# Patient Record
Sex: Female | Born: 1970 | Race: White | Hispanic: No | Smoking: Current every day smoker
Health system: Southern US, Community
[De-identification: ages and names within clinical notes are randomized; demographics above are authoritative.]

## PROBLEM LIST (undated history)

## (undated) DIAGNOSIS — N289 Disorder of kidney and ureter, unspecified: Secondary | ICD-10-CM

## (undated) HISTORY — PX: TUBAL LIGATION: SHX77

---

## 2005-06-18 ENCOUNTER — Ambulatory Visit: Payer: Self-pay | Admitting: Family Medicine

## 2005-07-02 ENCOUNTER — Ambulatory Visit: Payer: Self-pay | Admitting: Family Medicine

## 2005-07-17 ENCOUNTER — Observation Stay: Payer: Self-pay | Admitting: Obstetrics and Gynecology

## 2005-08-31 ENCOUNTER — Observation Stay: Payer: Self-pay | Admitting: Obstetrics and Gynecology

## 2005-09-03 ENCOUNTER — Inpatient Hospital Stay: Payer: Self-pay | Admitting: Obstetrics and Gynecology

## 2005-10-23 ENCOUNTER — Ambulatory Visit: Payer: Self-pay | Admitting: Obstetrics and Gynecology

## 2006-12-12 ENCOUNTER — Ambulatory Visit: Payer: Self-pay | Admitting: Internal Medicine

## 2007-06-09 ENCOUNTER — Ambulatory Visit: Payer: Self-pay | Admitting: Internal Medicine

## 2009-03-07 ENCOUNTER — Emergency Department: Payer: Self-pay | Admitting: Emergency Medicine

## 2009-11-06 ENCOUNTER — Emergency Department: Payer: Self-pay | Admitting: Emergency Medicine

## 2009-11-22 ENCOUNTER — Ambulatory Visit: Payer: Self-pay | Admitting: Urology

## 2012-05-13 ENCOUNTER — Ambulatory Visit: Payer: Self-pay

## 2012-05-13 LAB — COMPREHENSIVE METABOLIC PANEL
Albumin: 3.5 g/dL (ref 3.4–5.0)
Alkaline Phosphatase: 107 U/L (ref 50–136)
Anion Gap: 7 (ref 7–16)
Bilirubin,Total: 0.2 mg/dL (ref 0.2–1.0)
Chloride: 106 mmol/L (ref 98–107)
Co2: 28 mmol/L (ref 21–32)
Creatinine: 0.67 mg/dL (ref 0.60–1.30)
EGFR (Non-African Amer.): >60
Osmolality: 280 (ref 275–301)
SGOT(AST): 20 U/L (ref 15–37)
Sodium: 141 mmol/L (ref 136–145)
Total Protein: 7.3 g/dL (ref 6.4–8.2)

## 2012-05-13 LAB — URINALYSIS, COMPLETE
Glucose,UR: NEGATIVE mg/dL (ref 0–75)
Leukocyte Esterase: NEGATIVE
Nitrite: NEGATIVE
Ph: 6 (ref 4.5–8.0)
Specific Gravity: 1.025 (ref 1.003–1.030)

## 2012-05-13 LAB — RAPID INFLUENZA A&B ANTIGENS

## 2012-05-13 LAB — CBC WITH DIFFERENTIAL/PLATELET
Basophil #: 0.1 10*3/uL (ref 0.0–0.1)
Basophil %: 1.2 %
Eosinophil %: 3 %
Lymphocyte #: 2.7 10*3/uL (ref 1.0–3.6)
Lymphocyte %: 29.9 %
MCH: 23.4 pg — ABNORMAL LOW (ref 26.0–34.0)
MCHC: 30.6 g/dL — ABNORMAL LOW (ref 32.0–36.0)
Monocyte %: 5.7 %
Platelet: 306 10*3/uL (ref 150–440)
RBC: 4.71 10*6/uL (ref 3.80–5.20)
RDW: 17.1 % — ABNORMAL HIGH (ref 11.5–14.5)
WBC: 8.9 10*3/uL (ref 3.6–11.0)

## 2012-05-14 LAB — URINE CULTURE

## 2012-11-18 ENCOUNTER — Ambulatory Visit: Payer: Self-pay | Admitting: Family Medicine

## 2013-11-17 ENCOUNTER — Ambulatory Visit: Payer: Self-pay | Admitting: Physician Assistant

## 2013-11-20 ENCOUNTER — Ambulatory Visit: Payer: Self-pay | Admitting: Physician Assistant

## 2015-07-19 ENCOUNTER — Encounter: Payer: Self-pay | Admitting: *Deleted

## 2015-07-19 ENCOUNTER — Ambulatory Visit (INDEPENDENT_AMBULATORY_CARE_PROVIDER_SITE_OTHER): Payer: Self-pay

## 2015-07-19 ENCOUNTER — Ambulatory Visit
Admission: EM | Admit: 2015-07-19 | Discharge: 2015-07-19 | Disposition: A | Discharge status: Home / Self Care | Payer: Self-pay | Attending: Family Medicine | Admitting: Family Medicine

## 2015-07-19 DIAGNOSIS — T148 Other injury of unspecified body region: Secondary | ICD-10-CM

## 2015-07-19 DIAGNOSIS — M79672 Pain in left foot: Secondary | ICD-10-CM

## 2015-07-19 DIAGNOSIS — S93402A Sprain of unspecified ligament of left ankle, initial encounter: Secondary | ICD-10-CM

## 2015-07-19 DIAGNOSIS — T07XXXA Unspecified multiple injuries, initial encounter: Secondary | ICD-10-CM

## 2015-07-19 HISTORY — DX: Disorder of kidney and ureter, unspecified: N28.9

## 2015-07-19 MED ORDER — TRAMADOL HCL 50 MG PO TABS: 50.0000 mg | ORAL_TABLET | Freq: Three times a day (TID) | ORAL | Status: DC | PRN

## 2015-07-19 MED ORDER — IBUPROFEN 800 MG PO TABS: 800.0000 mg | ORAL_TABLET | Freq: Three times a day (TID) | ORAL | Status: AC | PRN

## 2015-07-19 NOTE — ED Provider Notes (Signed)
[facility]  ____________________________________________  Time seen: Approximately 3:00PM  I have reviewed the triage vital signs and the nursing notes.   HISTORY  Chief Complaint Foot Pain   HPI Autumn Edwards is a 45 y.o. female  presents with a complaint of left foot and left ankle pain since yesterday afternoon. Patient reports that she was going out the door to go to work and states that she stepped in between 2 decorative bricks that had a gap causing her to roll her left ankle and fall. Patient states that she fell down only 2 or 3 steps. Denies head injury or loss consciousness. Reports continued to work last night and overnight with some pain. Reports has remained ambulatory. States current pain is 6 out of 10 to left foot. Denies pain radiation. Denies numbness or tingling sensation.  States that she fell only because of rolling her ankle. Denies head injury or loss of consciousness. Denies neck or back pain or injury. Denies other extremity pain or injury.  Denies chest pain, shortness of breath, dizziness, weakness, hearing changes, abdominal pain, dysuria, neck pain, back pain, or other extremity swelling. Denies recent sickness.  PCP: [facility]  Patient's last menstrual period was 07/12/2015. Denies chance of pregnancy.    Past Medical History  Diagnosis Date  . Renal disorder     There are no active problems to display for this patient.   Past Surgical History  Procedure Laterality Date  . Tubal ligation      Current Outpatient Rx  Name  Route  Sig  Dispense  Refill                          Allergies Amoxicillin  Family History  Problem Relation Age of Onset  . Hypertension Father     Social History Social History  Substance Use Topics  . Smoking status: Current Every Day Smoker  . Smokeless tobacco: Never Used  . Alcohol Use: No    Review of Systems Constitutional: No fever/chills Eyes: No visual changes. ENT: No sore  throat. Cardiovascular: Denies chest pain. Respiratory: Denies shortness of breath. Gastrointestinal: No abdominal pain.  No nausea, no vomiting.  No diarrhea.  No constipation. Genitourinary: Negative for dysuria. Musculoskeletal: Negative for back pain.Left foot and left ankle pain. Skin: Negative for rash. Neurological: Negative for headaches, focal weakness or numbness.  10-point ROS otherwise negative.  ____________________________________________   PHYSICAL EXAM:  VITAL SIGNS: ED Triage Vitals  Enc Vitals Group     BP 07/19/15 1340 101/69 mmHg     Pulse Rate 07/19/15 1340 66     Resp 07/19/15 1340 18     Temp 07/19/15 1340 97.8 F (36.6 C)     Temp Source 07/19/15 1340 Oral     SpO2 07/19/15 1340 99 %     Weight 07/19/15 1340 250 lb (113.399 kg)     Height 07/19/15 1340  (1.626 m)     Head Cir --      Peak Flow --      Pain Score 07/19/15 1346 7     Pain Loc --      Pain Edu? --      Excl. in [place]? --     Constitutional: Alert and oriented. Well appearing and in no acute distress. Eyes: Conjunctivae are normal. PERRL. EOMI. Head: Atraumatic. Nontender. No ecchymosis.  Nose: No congestion/rhinnorhea.  Mouth/Throat: Mucous membranes are moist.  Oropharynx non-erythematous. Neck: No stridor.  No cervical spine tenderness to palpation. Hematological/Lymphatic/Immunilogical: No cervical lymphadenopathy. Cardiovascular: Normal rate, regular rhythm. Grossly normal heart sounds.  Good peripheral circulation. Respiratory: Normal respiratory effort.  No retractions. Lungs CTAB. Gastrointestinal: Soft and nontender. Musculoskeletal: No lower or upper extremity tenderness nor edema. . Bilateral pedal pulses equal and easily palpated. No cervical, thoracic or lumbar tenderness to palpation. Right lower calf mild ecchymosis present, nontender, no swelling, skin intact, no bony tenderness, full range of motion.  Except: Left lateral ankle and left lateral and dorsal mid foot  mild to moderate tenderness to palpation, mild ecchymosis, minimal swelling, left foot pain with plantar flexion and dorsiflexion as well as left ankle rotation, full range of motion present however with pain, capillary refill less than 2 seconds on the left foot distal toes, bilateral pedal pulses equal. Bilateral lower extremities otherwise nontender. Mild antalgic gait. Neurologic:  Normal speech and language. No gross focal neurologic deficits are appreciated.  Skin:  Skin is warm, dry and intact. No rash noted. Psychiatric: Mood and affect are normal. Speech and behavior are normal.   ____________________________________________   LABS (all labs ordered are listed, but only abnormal results are displayed)  Labs Reviewed - No data to display ____________________________________________  RADIOLOGY  EXAM: LEFT FOOT - COMPLETE 3+ VIEW  COMPARISON: None.  FINDINGS: No fracture, dislocation or suspicious focal osseous lesion. Mild degenerative changes in the dorsal tarsal joints. Tiny Achilles and small plantar left calcaneal spurs.  IMPRESSION: No fracture or malalignment.   Electronically Signed By: Delbert PhenixJason A Poff M.D. On: 07/19/2015 14:36          DG Ankle Complete Left (Final result) Result time: 07/19/15 14:36:59   Final result by Rad Results In Interface (07/19/15 14:36:59)   Narrative:   CLINICAL DATA: Acute injury, left foot and ankle pain  EXAM: LEFT ANKLE COMPLETE - 3+ VIEW  COMPARISON: 07/19/2015  FINDINGS: There is no evidence of fracture, dislocation, or joint effusion. There is no evidence of arthropathy or other focal bone abnormality. Soft tissues are unremarkable. Small plantar calcaneal spur.  IMPRESSION: Negative.   Electronically Signed By: Judie PetitM. Shick M.D. On: 07/19/2015 14:36      I, Renford DillsLindsey Youssef Footman, personally viewed and evaluated these images (plain radiographs) as part of my medical decision making, as well as reviewing  the written report by the radiologist.  ____________________________________________   PROCEDURES  Procedure(s) performed:  Left foot and ankle Ace wrap applied by RN. Neurovascular intact post application. Refused crutches.  ____________________________________________   INITIAL IMPRESSION / ASSESSMENT AND PLAN / ED COURSE  Pertinent labs & imaging results that were available during my care of the patient were reviewed by me and considered in my medical decision making (see chart for details).  Very well-appearing patient. No acute distress. Presents for the complaints of left ankle and left foot pain post mechanical fall yesterday afternoon at home. Denies head injury or loss conscious. No focal neurological deficit. Left foot and left lateral ankle tenderness on exam. Will evaluate by x-ray.  Per radiologist left foot and left ankle x-ray negative, no fracture or malalignment. Discussed with patient supportive treatments. Patient states that she does not want crutches or assistive walking devices. Ace wrap applied. Rest ice, support. When necessary ibuprofen as well as tramadol.Discussed indication, risks and benefits of medications with patient. Work note for today and tomorrow given.   Discussed follow up with Primary care physician this week. Follow up with podiatry as needed for continued pain. Discussed follow up and return  parameters including no resolution or any worsening concerns. Patient verbalized understanding and agreed to plan.   ____________________________________________   FINAL CLINICAL IMPRESSION(S) / ED DIAGNOSES  Final diagnoses:  Multiple contusions  Left foot pain  Left ankle sprain, initial encounter      Note: This dictation was prepared with Dragon dictation along with smaller phrase technology. Any transcriptional errors that result from this process are unintentional.    Renford Dills, NP 07/19/15 1946

## 2015-07-19 NOTE — ED Notes (Signed)
Patient hurt her left foot yesterday by sliiping down some stairs.

## 2015-07-19 NOTE — Discharge Instructions (Signed)
Take medication as prescribed. Rest. Drink plenty of fluids. Apply ice and elevate. Rest foot. Avoid reinjury.   Follow up with your primary care physician this week as needed. Follow up with PCP or podiatry as needed for continued pain.    Return to Urgent care for new or worsening concerns.     Ankle Sprain An ankle sprain is an injury to the strong, fibrous tissues (ligaments) that hold the bones of your ankle joint together.  CAUSES An ankle sprain is usually caused by a fall or by twisting your ankle. Ankle sprains most commonly occur when you step on the outer edge of your foot, and your ankle turns inward. People who participate in sports are more prone to these types of injuries.  SYMPTOMS   Pain in your ankle. The pain may be present at rest or only when you are trying to stand or walk.  Swelling.  Bruising. Bruising may develop immediately or within 1 to 2 days after your injury.  Difficulty standing or walking, particularly when turning corners or changing directions. DIAGNOSIS  Your caregiver will ask you details about your injury and perform a physical exam of your ankle to determine if you have an ankle sprain. During the physical exam, your caregiver will press on and apply pressure to specific areas of your foot and ankle. Your caregiver will try to move your ankle in certain ways. An X-ray exam may be done to be sure a bone was not broken or a ligament did not separate from one of the bones in your ankle (avulsion fracture).  TREATMENT  Certain types of braces can help stabilize your ankle. Your caregiver can make a recommendation for this. Your caregiver may recommend the use of medicine for pain. If your sprain is severe, your caregiver may refer you to a surgeon who helps to restore function to parts of your skeletal system (orthopedist) or a physical therapist. HOME CARE INSTRUCTIONS   Apply ice to your injury for 1-2 days or as directed by your caregiver. Applying ice  helps to reduce inflammation and pain.  Put ice in a plastic bag.  Place a towel between your skin and the bag.  Leave the ice on for 15-20 minutes at a time, every 2 hours while you are awake.  Only take over-the-counter or prescription medicines for pain, discomfort, or fever as directed by your caregiver.  Elevate your injured ankle above the level of your heart as much as possible for 2-3 days.  If your caregiver recommends crutches, use them as instructed. Gradually put weight on the affected ankle. Continue to use crutches or a cane until you can walk without feeling pain in your ankle.  If you have a plaster splint, wear the splint as directed by your caregiver. Do not rest it on anything harder than a pillow for the first 24 hours. Do not put weight on it. Do not get it wet. You may take it off to take a shower or bath.  You may have been given an elastic bandage to wear around your ankle to provide support. If the elastic bandage is too tight (you have numbness or tingling in your foot or your foot becomes cold and blue), adjust the bandage to make it comfortable.  If you have an air splint, you may blow more air into it or let air out to make it more comfortable. You may take your splint off at night and before taking a shower or bath. Wiggle your  toes in the splint several times per day to decrease swelling. SEEK MEDICAL CARE IF:   You have rapidly increasing bruising or swelling.  Your toes feel extremely cold or you lose feeling in your foot.  Your pain is not relieved with medicine. SEEK IMMEDIATE MEDICAL CARE IF:  Your toes are numb or blue.  You have severe pain that is increasing. MAKE SURE YOU:   Understand these instructions.  Will watch your condition.  Will get help right away if you are not doing well or get worse.   This information is not intended to replace advice given to you by your health care provider. Make sure you discuss any questions you have  with your health care provider.   Document Released: 04/20/2005 Document Revised: 05/11/2014 Document Reviewed: 05/02/2011 Elsevier Interactive Patient Education [employer]

## 2017-06-02 IMAGING — CR DG FOOT COMPLETE 3+V*L*
3 series · 3 of 3 positions shown · non-contrast
Comparison: None.

CLINICAL DATA: Lateral and dorsal left foot pain after injury

EXAM:
LEFT FOOT - COMPLETE 3+ VIEW

[foot ap]
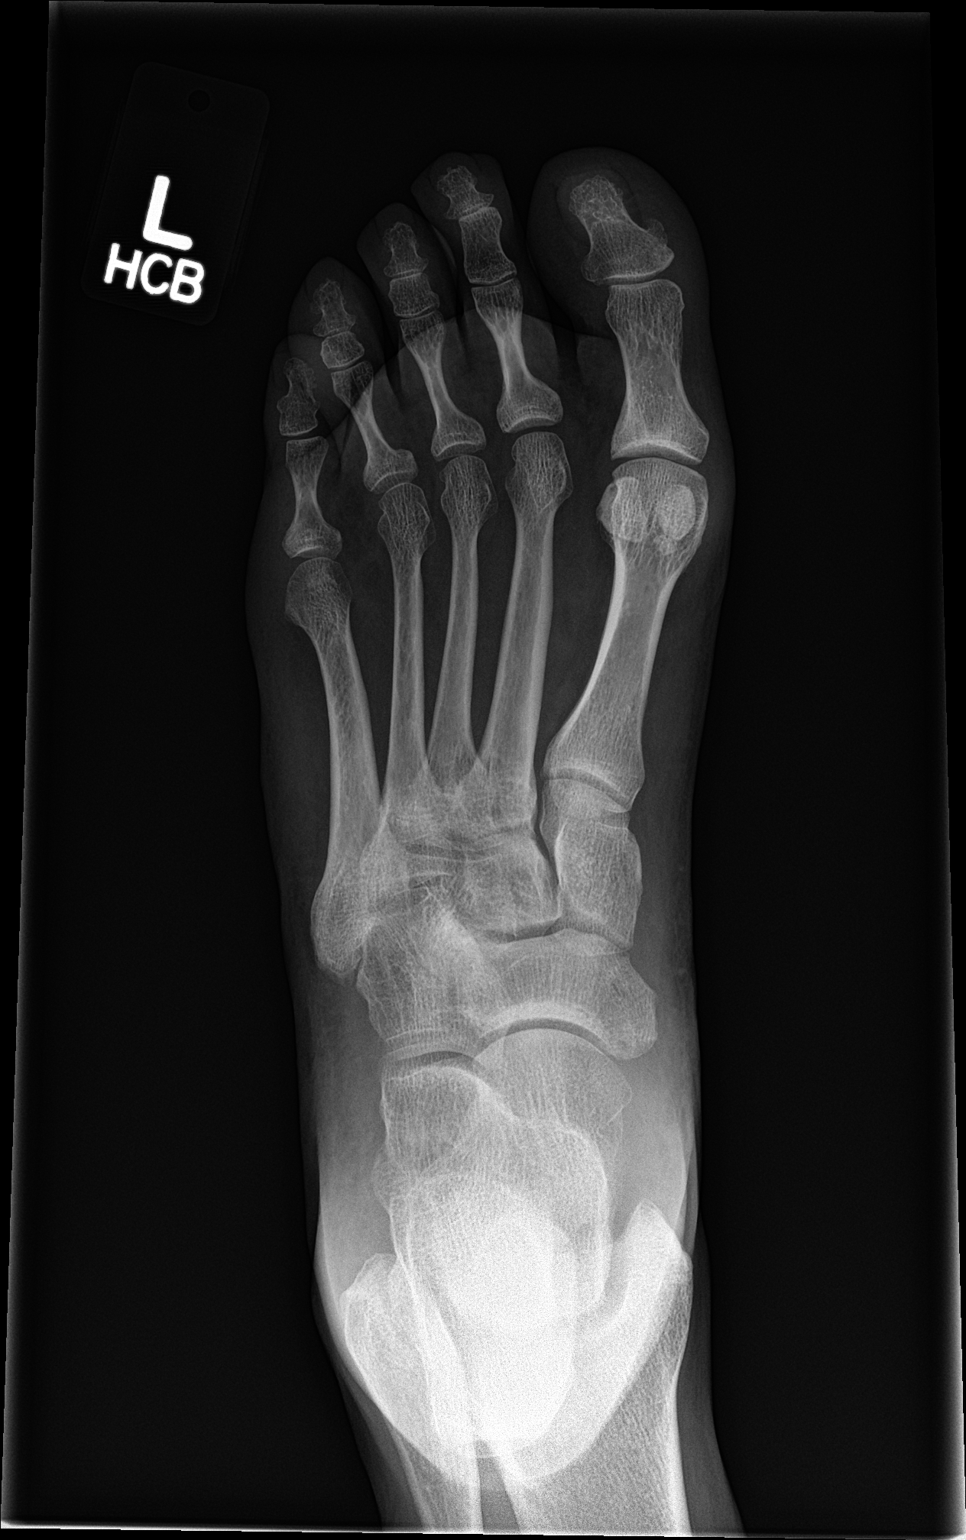

[foot obl]
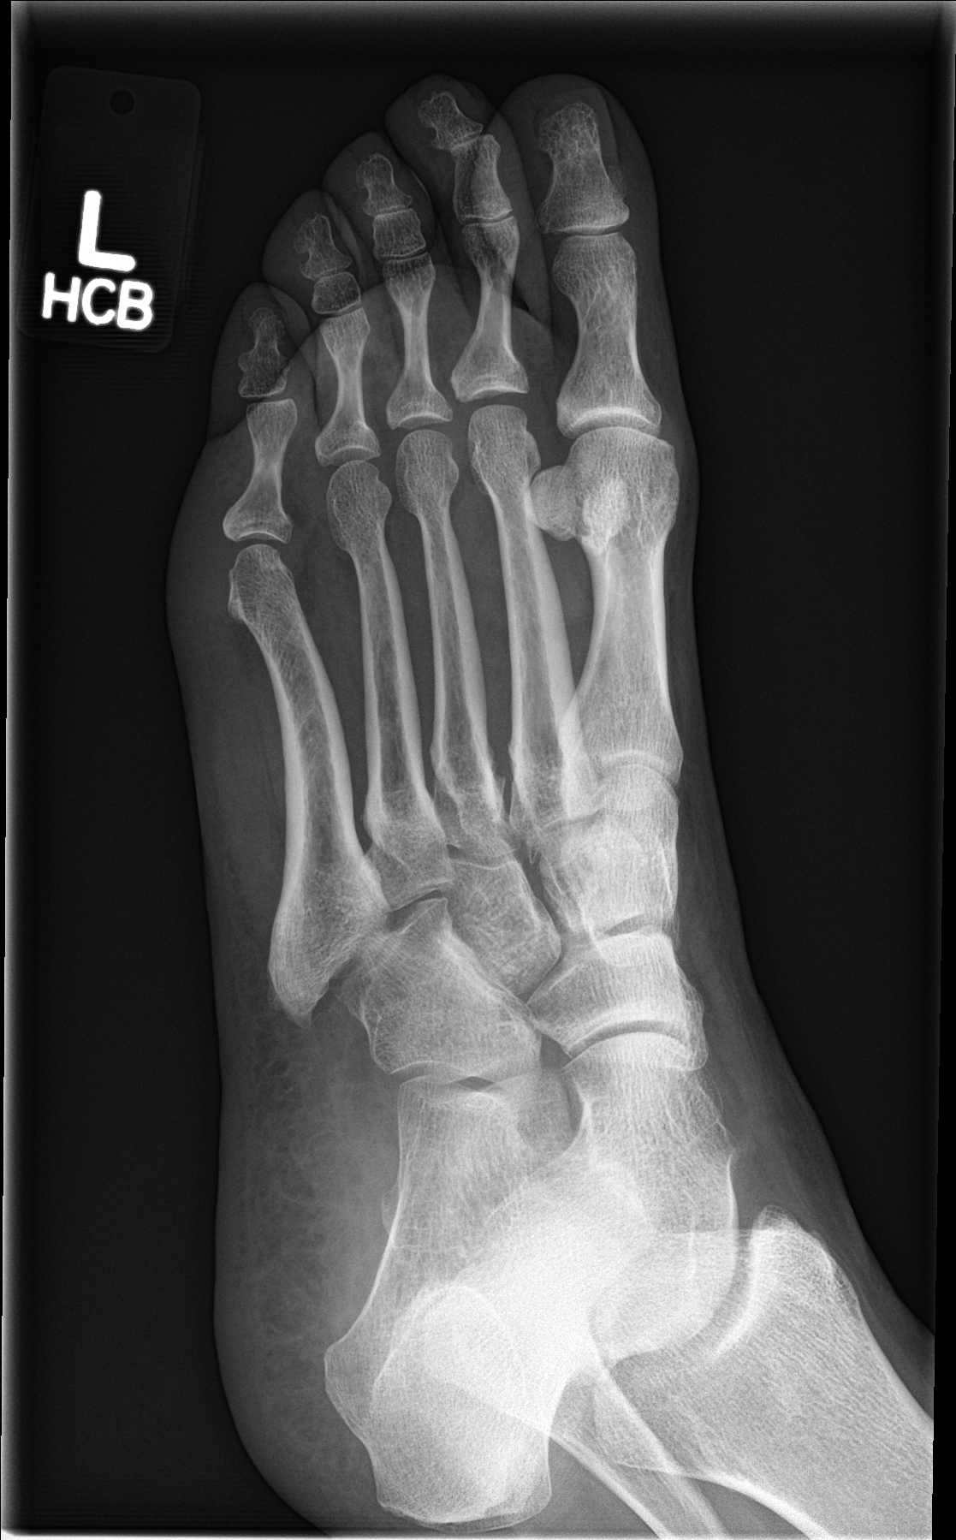

[foot lat]
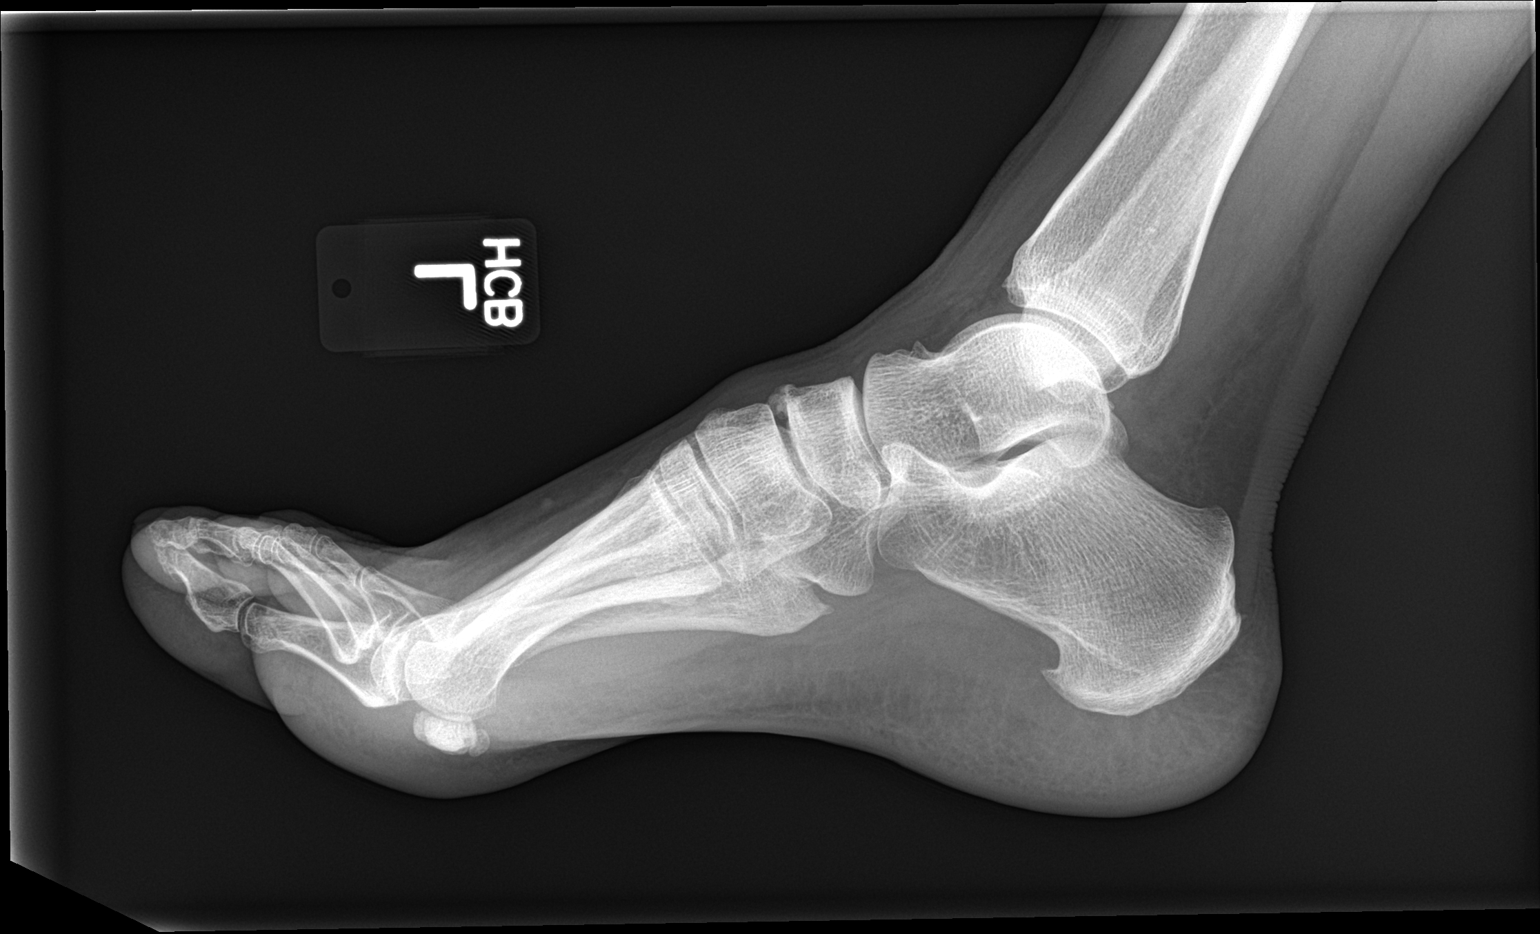

[3 of 3 positions shown; findings below may reference images not displayed]

FINDINGS: No fracture, dislocation or suspicious focal osseous lesion. Mild
degenerative changes in the dorsal tarsal joints. Tiny Achilles and
small plantar left calcaneal spurs.
IMPRESSION: No fracture or malalignment.

## 2019-02-26 LAB — POCT LIPID PANEL
HDL: 47
LDL: 86
Non-HDL: 122
POC Glucose: 101 mg/dl — AB (ref 70–99)
TC/HDL: 3.6
TC: 169
TRG: 182

## 2019-03-13 ENCOUNTER — Other Ambulatory Visit: Payer: Self-pay

## 2019-03-13 DIAGNOSIS — Z008 Encounter for other general examination: Secondary | ICD-10-CM

## 2019-03-13 NOTE — Progress Notes (Signed)
     Patient ID: Autumn Edwards, female    DOB: 1970-08-07, 48 y.o.   MRN: [id number]    Thank you!!  Apolonio Schneiders RN  [place] [facility] [employer]: [phone]  Cell:  [phone] Website: [email or web address]

## 2019-09-11 ENCOUNTER — Ambulatory Visit
Admission: EM | Admit: 2019-09-11 | Discharge: 2019-09-11 | Disposition: A | Discharge status: Home / Self Care | Payer: BC Managed Care – PPO | Attending: Family Medicine | Admitting: Family Medicine

## 2019-09-11 ENCOUNTER — Encounter: Payer: Self-pay | Admitting: Emergency Medicine

## 2019-09-11 ENCOUNTER — Other Ambulatory Visit: Payer: Self-pay

## 2019-09-11 DIAGNOSIS — M7501 Adhesive capsulitis of right shoulder: Secondary | ICD-10-CM

## 2019-09-11 MED ORDER — TRAMADOL HCL 50 MG PO TABS: ORAL_TABLET | ORAL | 0 refills | Status: AC

## 2019-09-11 MED ORDER — PREDNISONE 10 MG PO TABS: ORAL_TABLET | ORAL | 0 refills | Status: AC

## 2019-09-11 NOTE — ED Provider Notes (Signed)
[facility]    CSN: [id number] Arrival date & time: 09/11/19  0844      History   Chief Complaint Chief Complaint  Patient presents with  . Shoulder Pain    right    HPI Dollene Mallery is a 49 y.o. female.   49 yo female with a c/o right shoulder pain for the past 3 days. States she woke up with pain to the shoulder and not being able to move it well. Denies any falls or other traumatic injury. Denies any rash, fevers, chills, numbness/tingling. Pain is worse with movement.    Shoulder Pain   Past Medical History:  Diagnosis Date  . Renal disorder     There are no problems to display for this patient.   Past Surgical History:  Procedure Laterality Date  . TUBAL LIGATION      OB History   No obstetric history on file.      Home Medications    Prior to Admission medications   Medication Sig Start Date End Date Taking? Authorizing Provider  ibuprofen (ADVIL,MOTRIN) 800 MG tablet Take 1 tablet (800 mg total) by mouth every 8 (eight) hours as needed for mild pain or moderate pain. 07/19/15   Marylene Land, NP  predniSONE (DELTASONE) 10 MG tablet Start 60 mg po day one, then 50 mg po day two, taper by 10 mg daily until complete. 09/11/19   Norval Gable, MD  traMADol Veatrice Bourbon) 50 MG tablet 1-2 tabs po q 8 hours prn 09/11/19   Norval Gable, MD    Family History Family History  Problem Relation Age of Onset  . Hypertension Father     Social History Social History   Tobacco Use  . Smoking status: Current Every Day Smoker    Types: Cigarettes  . Smokeless tobacco: Never Used  Substance Use Topics  . Alcohol use: No  . Drug use: No     Allergies   Amoxicillin   Review of Systems Review of Systems   Physical Exam Triage Vital Signs ED Triage Vitals  Enc Vitals Group     BP 09/11/19 0913 120/73     Pulse Rate 09/11/19 0913 93     Resp 09/11/19 0913 18     Temp 09/11/19 0913 98.2 F (36.8 C)     Temp Source 09/11/19 0913  Oral     SpO2 09/11/19 0913 98 %     Weight 09/11/19 0911 205 lb 0.4 oz (93 kg)     Height 09/11/19 0911 5\' 4"  (1.626 m)     Head Circumference --      Peak Flow --      Pain Score 09/11/19 0910 8     Pain Loc --      Pain Edu? --      Excl. in [place]? --    No data found.  Updated Vital Signs BP 120/73 (BP Location: Left Arm)   Pulse 93   Temp 98.2 F (36.8 C) (Oral)   Resp 18   Ht 5\' 4"  (1.626 m)   Wt 93 kg   LMP 09/06/2019 (Approximate)   SpO2 98%   BMI 35.19 kg/m   Visual Acuity Right Eye Distance:   Left Eye Distance:   Bilateral Distance:    Right Eye Near:   Left Eye Near:    Bilateral Near:     Physical Exam Vitals and nursing note reviewed.  Constitutional:      General: She is not in acute  distress.    Appearance: She is not toxic-appearing or diaphoretic.  Musculoskeletal:     Right shoulder: Tenderness present. No swelling, deformity, effusion, laceration or crepitus. Decreased range of motion. Normal pulse.     Comments: Right upper extremity neurovascularly intact  Neurological:     Mental Status: She is alert.      UC Treatments / Results  Labs (all labs ordered are listed, but only abnormal results are displayed) Labs Reviewed - No data to display  EKG   Radiology No results found.  Procedures Procedures (including critical care time)  Medications Ordered in UC Medications - No data to display  Initial Impression / Assessment and Plan / UC Course  I have reviewed the triage vital signs and the nursing notes.  Pertinent labs & imaging results that were available during my care of the patient were reviewed by me and considered in my medical decision making (see chart for details).     Final Clinical Impressions(s) / UC Diagnoses   Final diagnoses:  Adhesive capsulitis of right shoulder    ED Prescriptions    Medication Sig Dispense Auth. Provider   predniSONE (DELTASONE) 10 MG tablet Start 60 mg po day one, then 50 mg po day  two, taper by 10 mg daily until complete. 21 tablet Miliana Gangwer, Pamala Hurry, MD   traMADol (ULTRAM) 50 MG tablet 1-2 tabs po q 8 hours prn 15 tablet Mohamad Bruso, Pamala Hurry, MD      1.  diagnosis reviewed with patient 2. rx as per orders above; reviewed possible side effects, interactions, risks and benefits  3. Recommend supportive treatment with otc tylenol, range of motion exercises 4. Follow-up prn if symptoms worsen or don't improve  I have reviewed the PDMP during this encounter.   Payton Mccallum, MD 09/11/19 1729

## 2019-09-11 NOTE — ED Triage Notes (Signed)
Pt c/o right shoulder pain. Started about 3 days ago. She states she woke up with pain and not being able to move her shoulder. She has tried tylenol and ibuprofen and topical creams without relief. No known injury. She states she lifts a lot at work and is unable to work.

## 2022-02-02 ENCOUNTER — Other Ambulatory Visit: Payer: Self-pay | Admitting: Family Medicine

## 2022-02-02 DIAGNOSIS — Z1231 Encounter for screening mammogram for malignant neoplasm of breast: Secondary | ICD-10-CM

## 2022-02-03 ENCOUNTER — Other Ambulatory Visit: Payer: Self-pay | Admitting: Family Medicine

## 2022-02-03 DIAGNOSIS — Z1231 Encounter for screening mammogram for malignant neoplasm of breast: Secondary | ICD-10-CM

## 2022-02-04 ENCOUNTER — Inpatient Hospital Stay: Admission: RE | Admit: 2022-02-04 | Payer: BC Managed Care – PPO | Source: Ambulatory Visit
